# Patient Record
Sex: Male | Born: 1980 | Race: Black or African American | Hispanic: No | Marital: Married | Smoking: Current every day smoker
Health system: Southern US, Community
[De-identification: ages and names within clinical notes are randomized; demographics above are authoritative.]

---

## 2005-06-15 ENCOUNTER — Emergency Department: Payer: Self-pay | Admitting: Emergency Medicine

## 2006-09-05 IMAGING — CR DG CHEST 2V
1 series · 2 of 2 positions shown · non-contrast
Comparison: none

REASON FOR EXAM: Chest wall pain
COMMENTS:

PROCEDURE:     DXR - DXR CHEST PA (OR AP) AND LATERAL  - June 15, 2005 [DATE]
RESULT:          The lungs are well expanded with a suggestion of mild
hyperinflation.  There is no evidence of a pneumothorax, infiltrate, or
atelectasis.  I seen no pleural effusion.  The mediastinum is not widened.

[Series 1: view not recorded · 0.17mm/px · 2 of 2 slices shown]
[im 1/2]
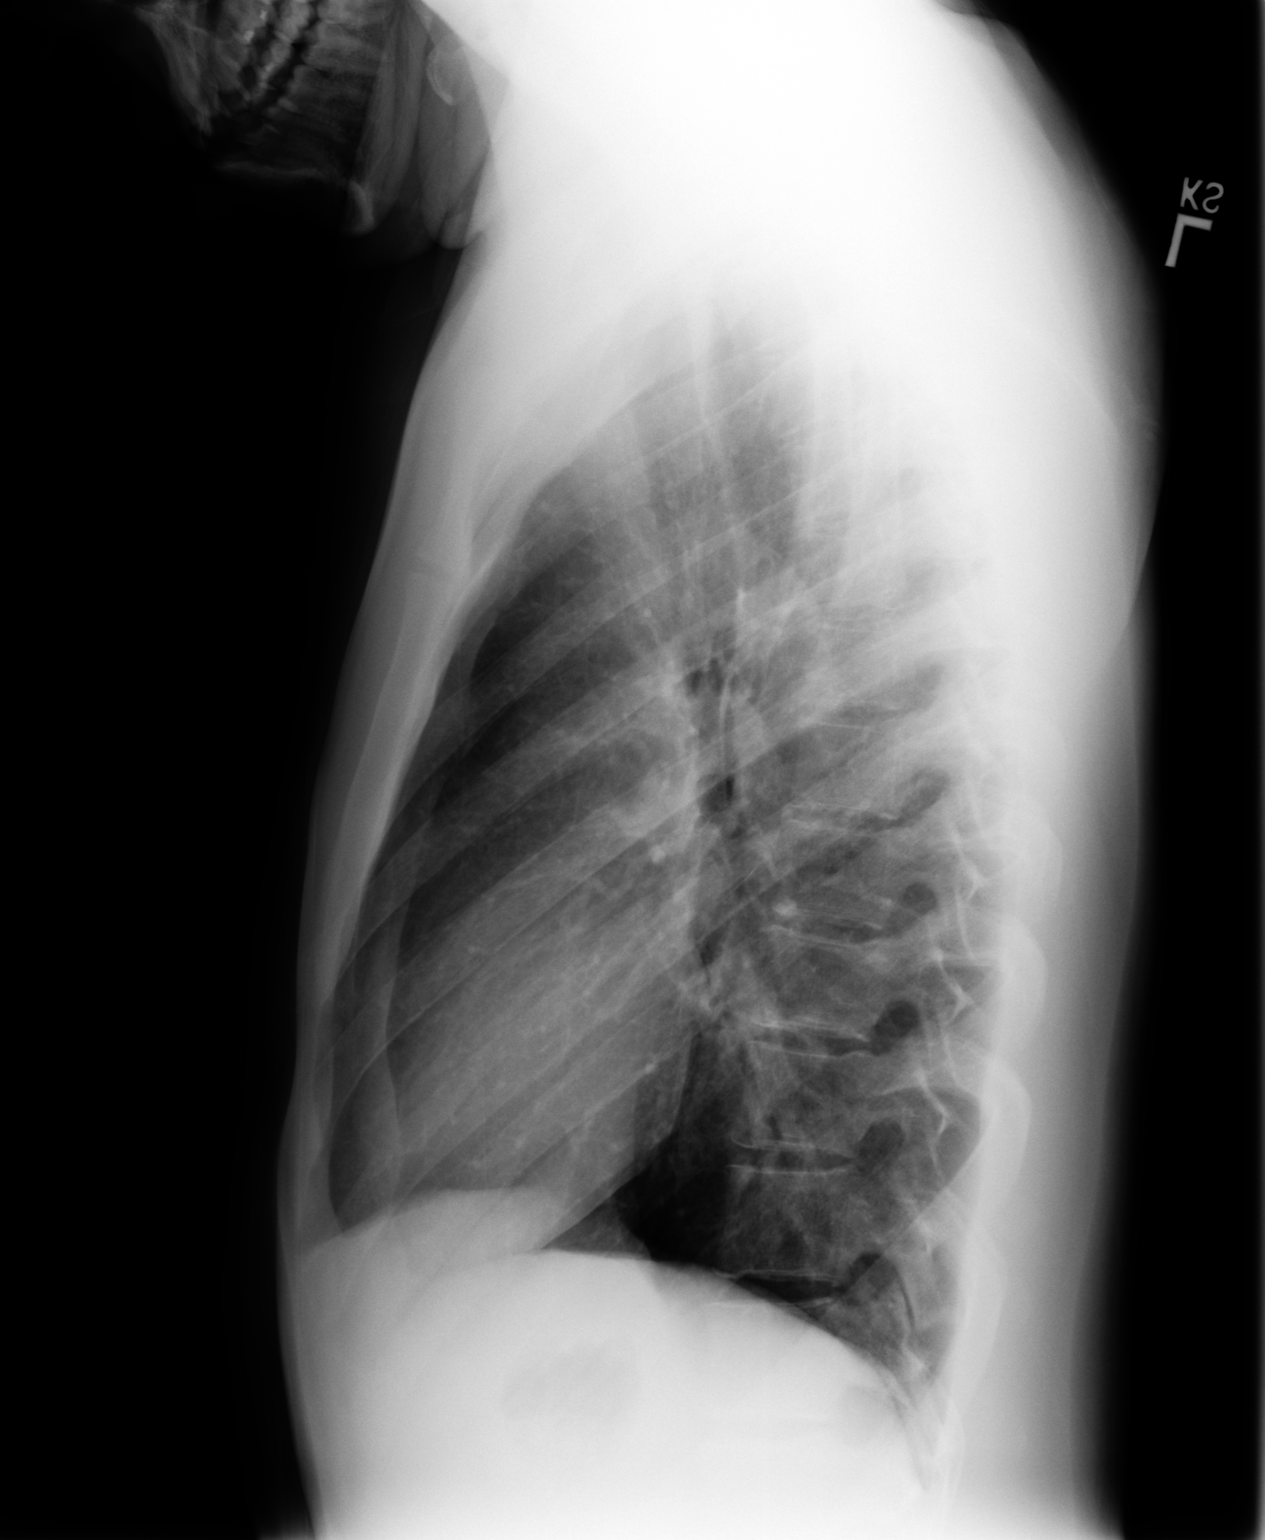
[im 2/2]
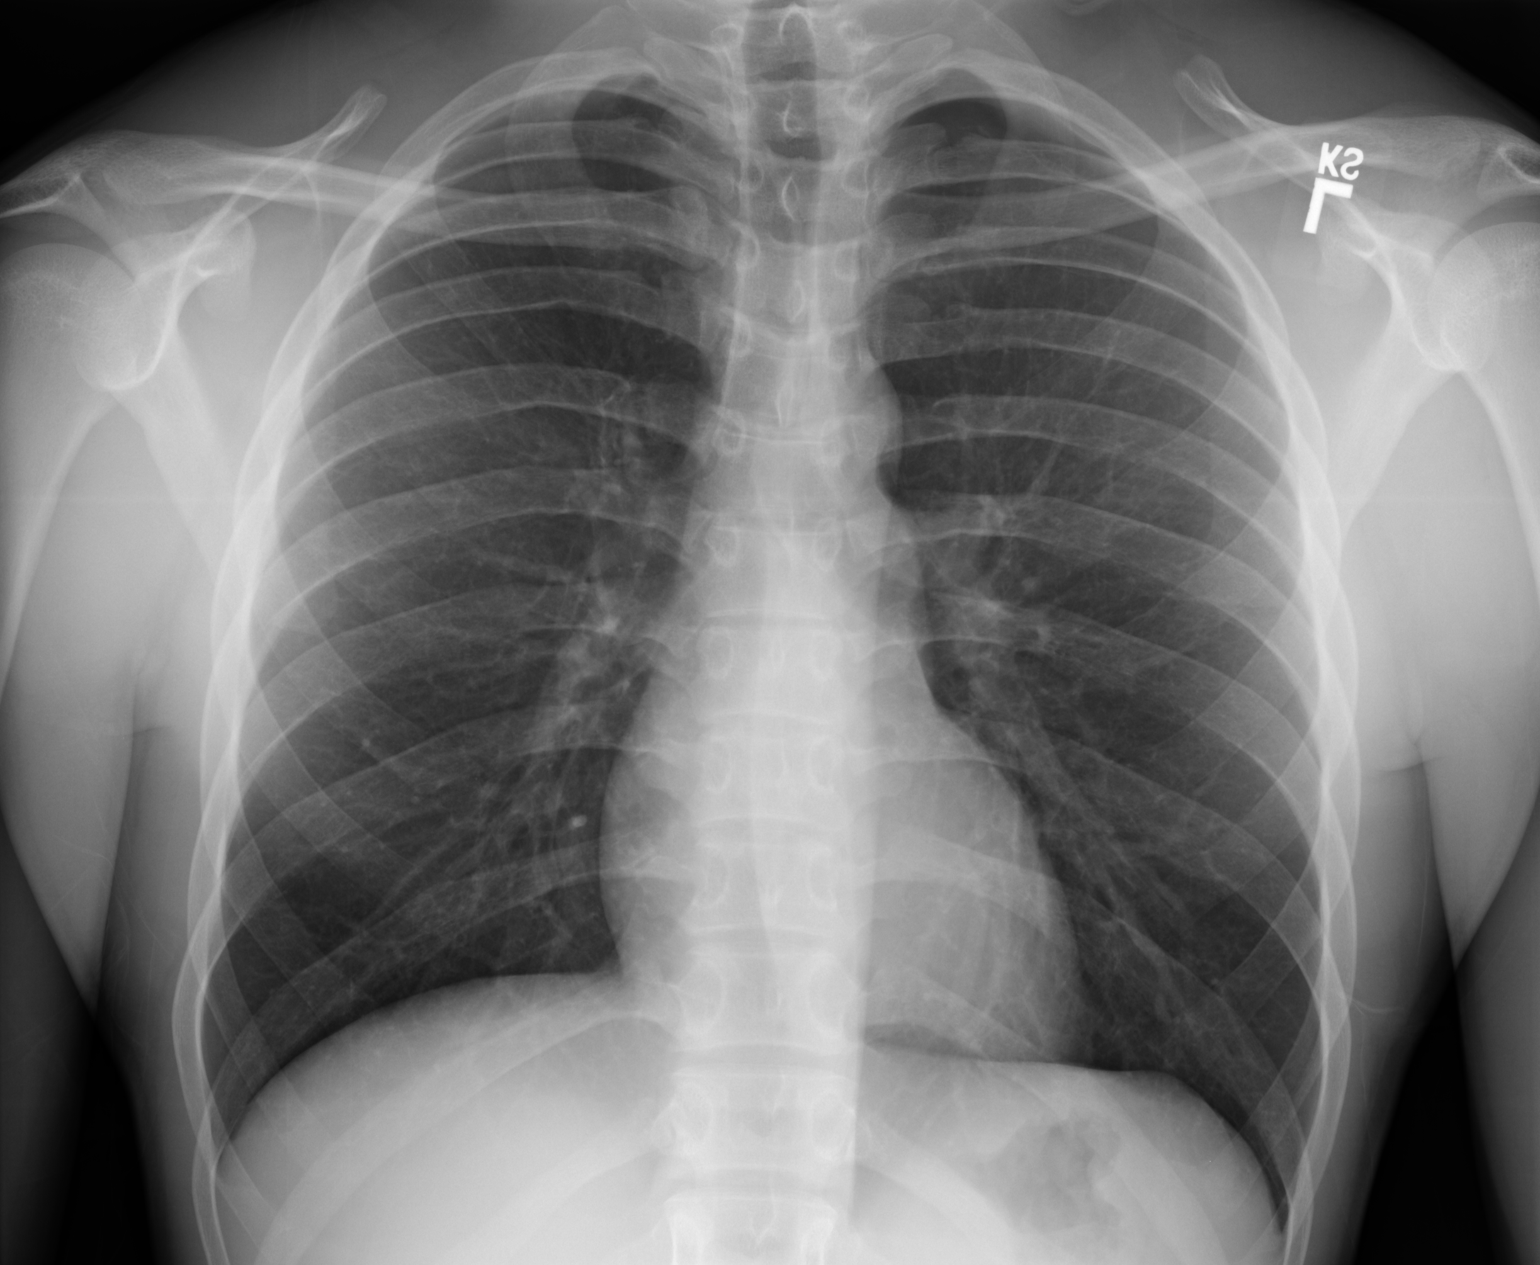

[2 of 2 positions shown; findings below may reference images not displayed]

IMPRESSION: I do not see evidence of  acute cardiopulmonary
disease.

## 2007-08-31 ENCOUNTER — Emergency Department: Payer: Self-pay | Admitting: Emergency Medicine

## 2007-10-15 ENCOUNTER — Emergency Department: Payer: Self-pay | Admitting: Emergency Medicine

## 2009-09-17 ENCOUNTER — Emergency Department: Payer: Self-pay | Admitting: Emergency Medicine

## 2012-08-13 ENCOUNTER — Ambulatory Visit: Payer: Self-pay | Admitting: Family Medicine

## 2014-07-30 ENCOUNTER — Ambulatory Visit: Payer: Self-pay | Admitting: Physician Assistant

## 2014-09-08 ENCOUNTER — Ambulatory Visit: Payer: Self-pay

## 2015-03-08 ENCOUNTER — Encounter: Payer: Self-pay | Admitting: Emergency Medicine

## 2015-03-08 ENCOUNTER — Emergency Department
Admission: EM | Admit: 2015-03-08 | Discharge: 2015-03-08 | Disposition: A | Discharge status: Home / Self Care | Payer: Self-pay | Attending: Emergency Medicine | Admitting: Emergency Medicine

## 2015-03-08 DIAGNOSIS — Z79899 Other long term (current) drug therapy: Secondary | ICD-10-CM | POA: Insufficient documentation

## 2015-03-08 DIAGNOSIS — B354 Tinea corporis: Secondary | ICD-10-CM | POA: Insufficient documentation

## 2015-03-08 DIAGNOSIS — Z720 Tobacco use: Secondary | ICD-10-CM | POA: Insufficient documentation

## 2015-03-08 MED ORDER — TERBINAFINE HCL 250 MG PO TABS: 250.0000 mg | ORAL_TABLET | Freq: Every day | ORAL | Status: AC

## 2015-03-08 NOTE — ED Notes (Signed)
States he has had rash to hips area with itching for sometime. Now has an area under left arm.

## 2015-03-08 NOTE — ED Provider Notes (Signed)
[facility] Emergency Department Provider Note  ____________________________________________  Time seen: Approximately 10:29 AM  I have reviewed the triage vital signs and the nursing notes.   HISTORY  Chief Complaint Rash    HPI Scott Clarke is a 34 y.o. male is for rash on both groins for approximately a year. No relief with hydrocortisone cream. In addition he complains of having a rash to his left axilla.  History reviewed. No pertinent past medical history.  There are no active problems to display for this patient.   History reviewed. No pertinent past surgical history.  Current Outpatient Rx  Name  Route  Sig  Dispense  Refill  . terbinafine (LAMISIL) 250 MG tablet   Oral   Take 1 tablet (250 mg total) by mouth daily.   42 tablet   0     Allergies Review of patient's allergies indicates no known allergies.  No family history on file.  Social History History  Substance Use Topics  . Smoking status: Current Every Day Smoker  . Smokeless tobacco: Never Used  . Alcohol Use: Yes    Review of Systems Constitutional: No fever/chills Eyes: No visual changes. ENT: No sore throat. Cardiovascular: Denies chest pain. Respiratory: Denies shortness of breath. Gastrointestinal: No abdominal pain.  No nausea, no vomiting.  No diarrhea.  No constipation. Genitourinary: Negative for dysuria. Musculoskeletal: Negative for back pain. Skin: Positive for rash in both groins. In left axilla Neurological: Negative for headaches, focal weakness or numbness.  10-point ROS otherwise negative.  ____________________________________________   PHYSICAL EXAM:  VITAL SIGNS: ED Triage Vitals  Enc Vitals Group     BP 03/08/15 1014 129/82 mmHg     Pulse Rate 03/08/15 1014 68     Resp 03/08/15 1014 20     Temp 03/08/15 1014 98.6 F (37 C)     Temp Source 03/08/15 1014 Oral     SpO2 03/08/15 1014 98 %     Weight 03/08/15 1014 205 lb  (92.987 kg)     Height 03/08/15 1014  (1.88 m)     Head Cir --      Peak Flow --      Pain Score --      Pain Loc --      Pain Edu? --      Excl. in [place]? --     Constitutional: Alert and oriented. Well appearing and in no acute distress. Eyes: Conjunctivae are normal. PERRL. EOMI. Head: Atraumatic. Nose: No congestion/rhinnorhea. Mouth/Throat: Mucous membranes are moist.  Oropharynx non-erythematous. Neck: No stridor.   Cardiovascular: Normal rate, regular rhythm. Grossly normal heart sounds.  Good peripheral circulation. Respiratory: Normal respiratory effort.  No retractions. Lungs CTAB. Gastrointestinal: Soft and nontender. No distention. No abdominal bruits. No CVA tenderness. Musculoskeletal: No lower extremity tenderness nor edema.  No joint effusions. Neurologic:  Normal speech and language. No gross focal neurologic deficits are appreciated. Speech is normal. No gait instability. Skin:  Left and right inguinal area extensive 8 x 14 cm rash with nummular edematous lesions with darkened center as opposed to a clear center. Typical presentation of a tinea corporis. New-onset pustular lesions left axilla consistent with same. Psychiatric: Mood and affect are normal. Speech and behavior are normal.  ____________________________________________   LABS (all labs ordered are listed, but only abnormal results are displayed)  Labs Reviewed - No data to display ____________________________________________  EKG  Deferred ____________________________________________  RADIOLOGY  Not applicable ____________________________________________   PROCEDURES  Procedure(s)  performed: None  Critical Care performed: No  ____________________________________________   INITIAL IMPRESSION / ASSESSMENT AND PLAN / ED COURSE  Pertinent labs & imaging results that were available during my care of the patient were reviewed by me and considered in my medical decision making (see chart  for details).  Tinea corporis. Rx given for terbinafine 250 mg daily for 6 weeks. She intended to follow-up with dermatologist as needed. He voices no other emergency medical complaints at this visit. ____________________________________________   FINAL CLINICAL IMPRESSION(S) / ED DIAGNOSES  Final diagnoses:  Tinea corporis      Evangeline DakinCharles M Meshelle Holness, PA-C 03/08/15 1053  Darien Ramusavid W Kaminski, MD 03/09/15 [phone]

## 2015-03-08 NOTE — Discharge Instructions (Signed)

## 2015-11-29 IMAGING — CR RIGHT FOOT COMPLETE - 3+ VIEW
3 series · 3 of 3 positions shown · non-contrast
Comparison: None.

CLINICAL DATA: Fell last night with pain and swelling of the dorsum
of the foot

EXAM:
RIGHT FOOT COMPLETE - 3+ VIEW

[foot ap]
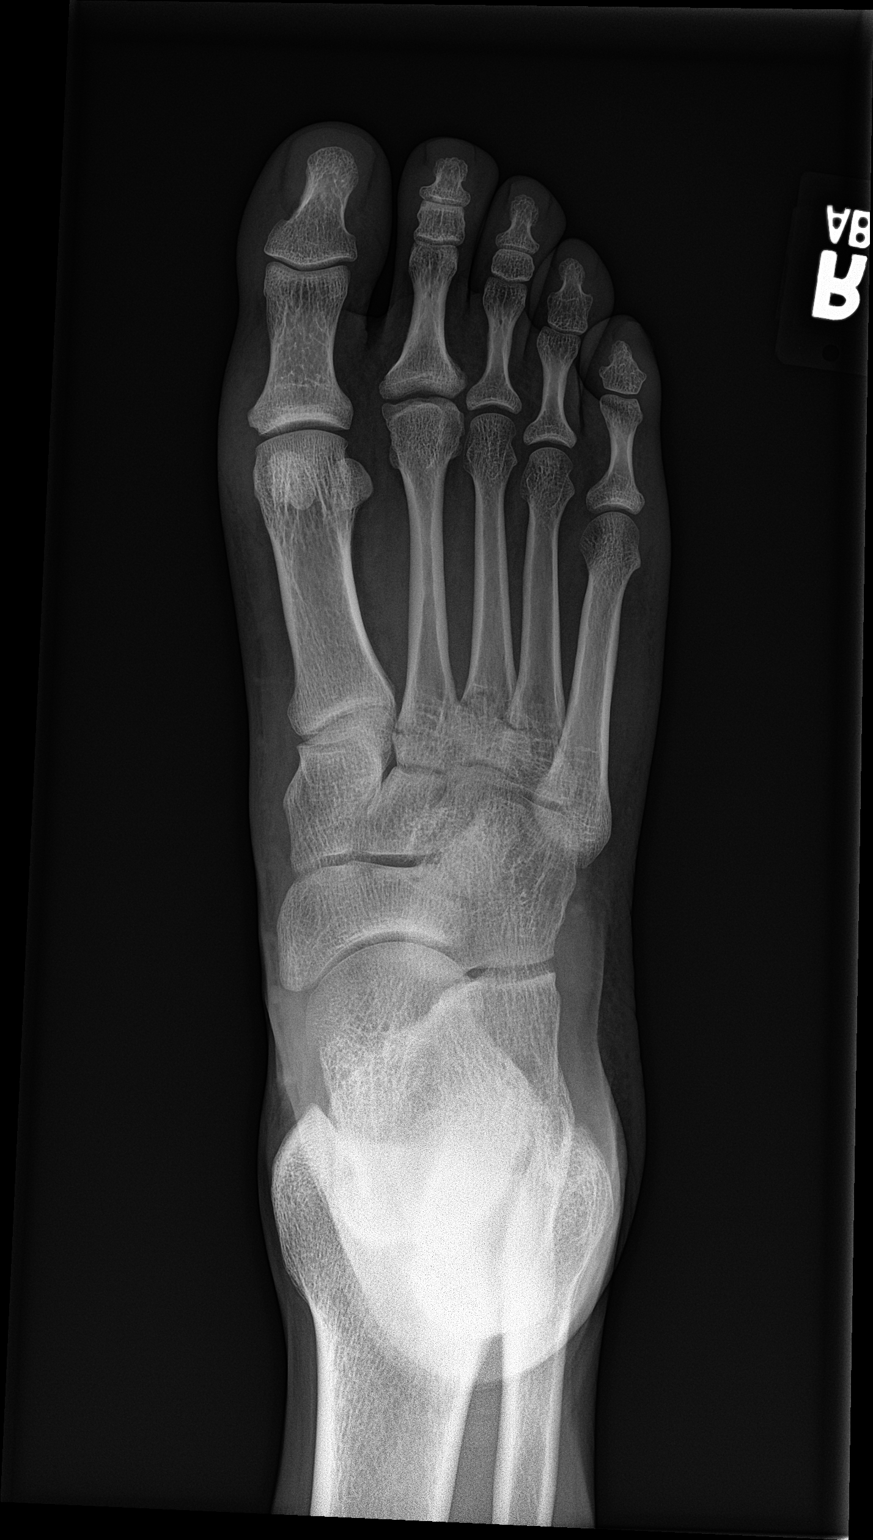

[foot obl]
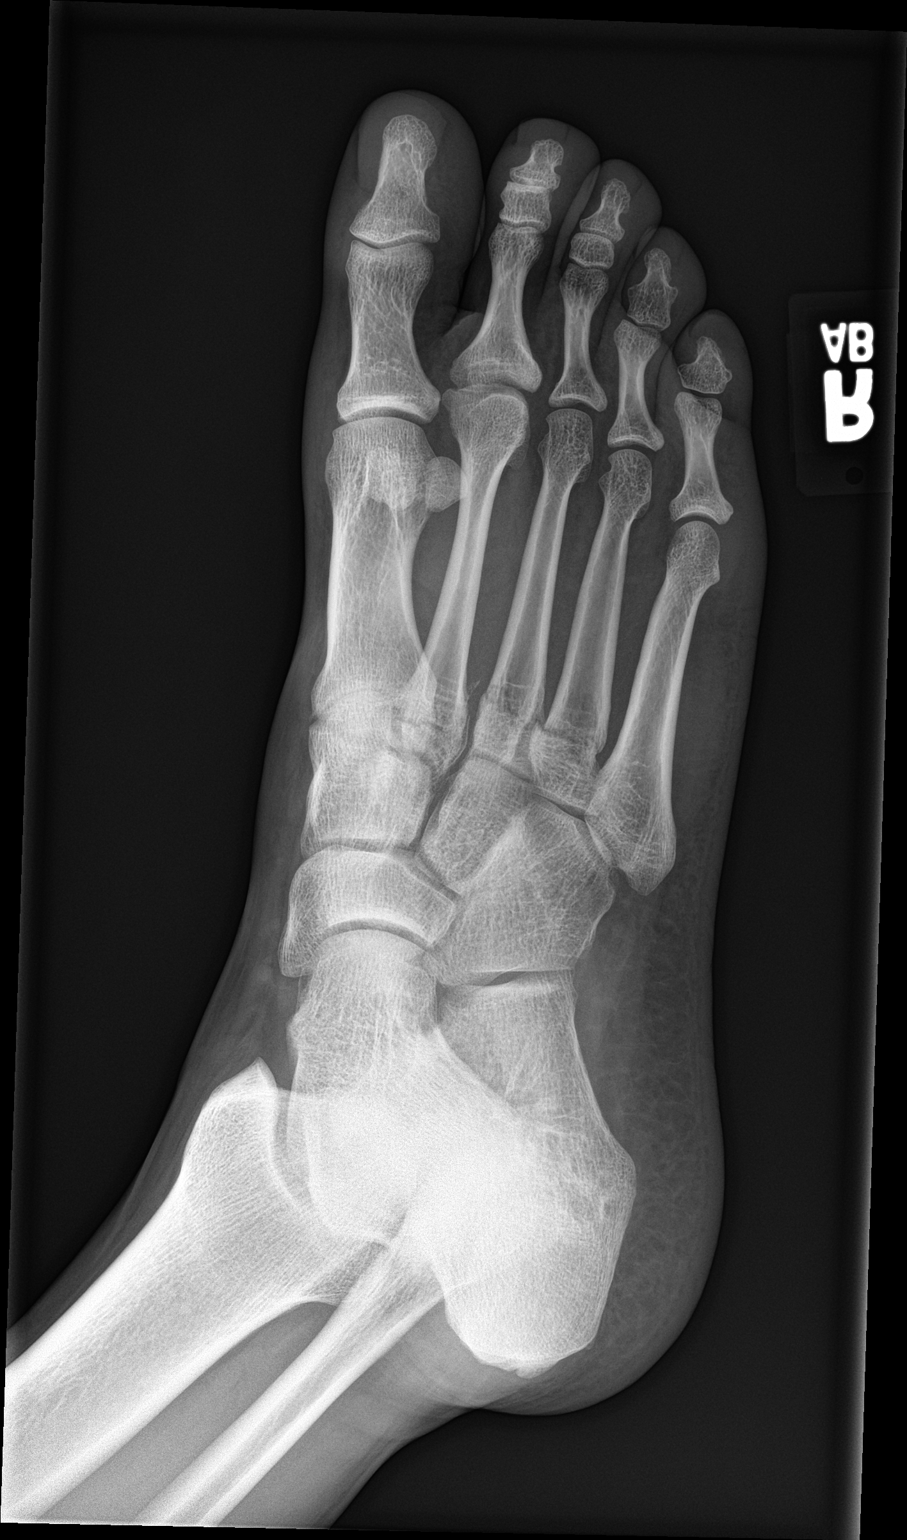

[foot lat]
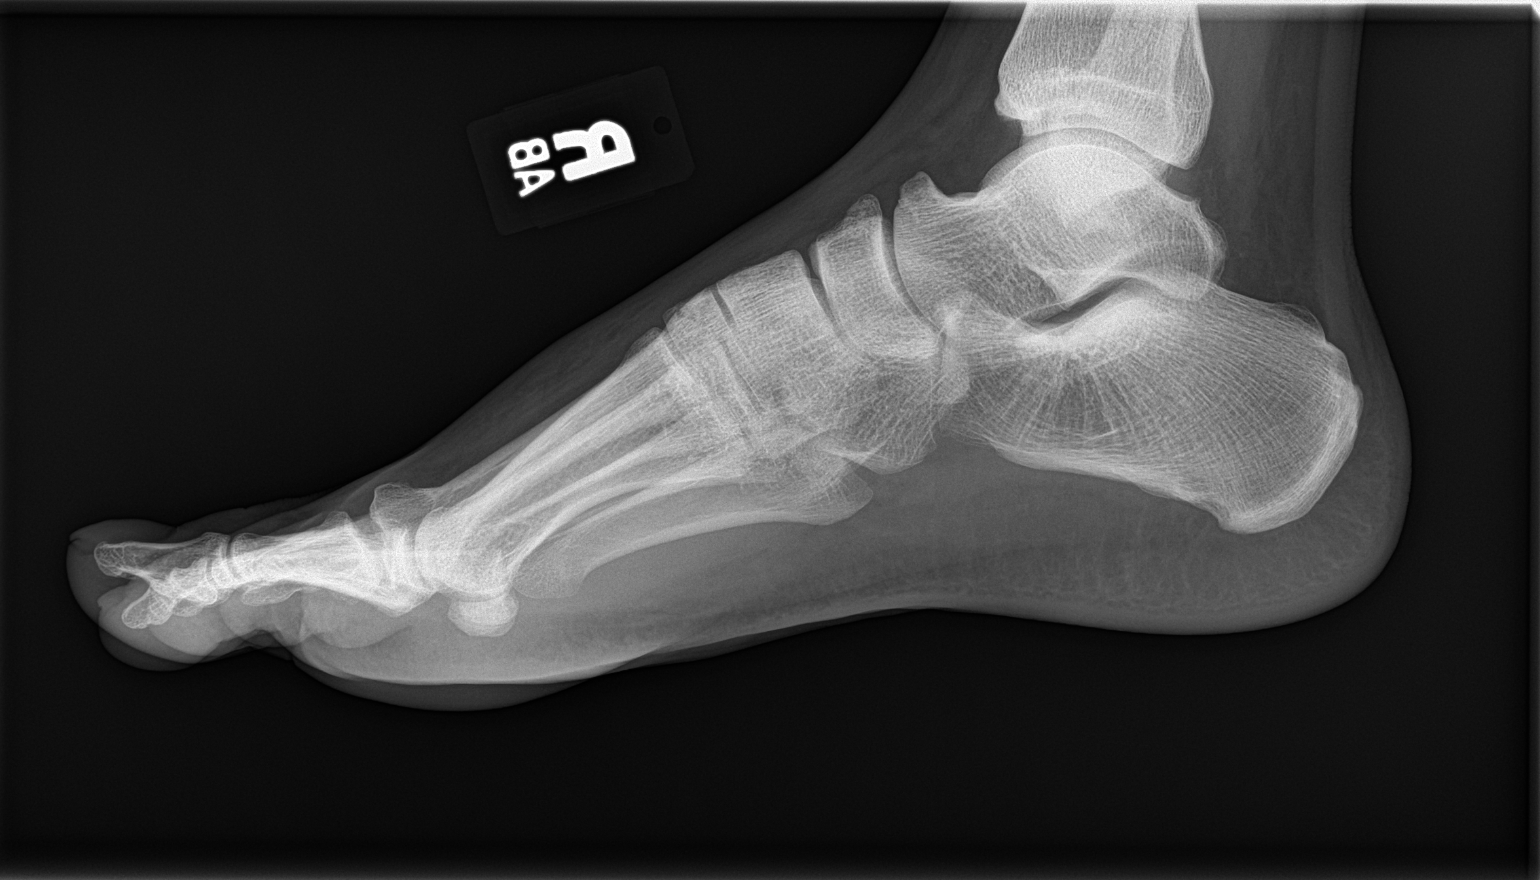

[3 of 3 positions shown; findings below may reference images not displayed]

FINDINGS: There are transverse nondisplaced fractures through the bases of the
right second, third, and fourth metatarsals. No other acute
abnormality is seen. Flattening of the head of the right second
metatarsal is noted consistent with avascular necrosis, Anybal
Sebbah infraction.

IMPRESSI:
IMPRESSI
Transverse acute fractures of the bases of the right second, third,
and fourth metatarsals.

## 2017-11-24 DIAGNOSIS — J3489 Other specified disorders of nose and nasal sinuses: Secondary | ICD-10-CM | POA: Diagnosis not present

## 2017-11-24 DIAGNOSIS — J339 Nasal polyp, unspecified: Secondary | ICD-10-CM | POA: Diagnosis not present

## 2017-11-24 DIAGNOSIS — K056 Periodontal disease, unspecified: Secondary | ICD-10-CM | POA: Diagnosis not present

## 2017-12-05 DIAGNOSIS — Z13220 Encounter for screening for lipoid disorders: Secondary | ICD-10-CM | POA: Diagnosis not present

## 2017-12-05 DIAGNOSIS — Z23 Encounter for immunization: Secondary | ICD-10-CM | POA: Diagnosis not present

## 2017-12-05 DIAGNOSIS — Z131 Encounter for screening for diabetes mellitus: Secondary | ICD-10-CM | POA: Diagnosis not present

## 2017-12-05 DIAGNOSIS — L723 Sebaceous cyst: Secondary | ICD-10-CM | POA: Diagnosis not present

## 2017-12-05 DIAGNOSIS — Z0000 Encounter for general adult medical examination without abnormal findings: Secondary | ICD-10-CM | POA: Diagnosis not present

## 2018-03-09 DIAGNOSIS — L720 Epidermal cyst: Secondary | ICD-10-CM | POA: Diagnosis not present

## 2018-03-09 DIAGNOSIS — L918 Other hypertrophic disorders of the skin: Secondary | ICD-10-CM | POA: Diagnosis not present

## 2018-03-09 DIAGNOSIS — L83 Acanthosis nigricans: Secondary | ICD-10-CM | POA: Diagnosis not present

## 2018-03-25 DIAGNOSIS — L720 Epidermal cyst: Secondary | ICD-10-CM | POA: Diagnosis not present

## 2018-03-25 DIAGNOSIS — L0291 Cutaneous abscess, unspecified: Secondary | ICD-10-CM | POA: Diagnosis not present

## 2018-03-25 DIAGNOSIS — L700 Acne vulgaris: Secondary | ICD-10-CM | POA: Diagnosis not present

## 2018-04-02 DIAGNOSIS — L0201 Cutaneous abscess of face: Secondary | ICD-10-CM | POA: Diagnosis not present

## 2018-05-18 DIAGNOSIS — L0201 Cutaneous abscess of face: Secondary | ICD-10-CM | POA: Diagnosis not present

## 2022-10-18 ENCOUNTER — Encounter: Payer: Self-pay | Admitting: Emergency Medicine

## 2022-10-18 ENCOUNTER — Ambulatory Visit
Admission: EM | Admit: 2022-10-18 | Discharge: 2022-10-18 | Disposition: A | Discharge status: Home / Self Care | Payer: PRIVATE HEALTH INSURANCE | Attending: Emergency Medicine | Admitting: Emergency Medicine

## 2022-10-18 DIAGNOSIS — B349 Viral infection, unspecified: Secondary | ICD-10-CM | POA: Diagnosis present

## 2022-10-18 DIAGNOSIS — Z20822 Contact with and (suspected) exposure to covid-19: Secondary | ICD-10-CM | POA: Insufficient documentation

## 2022-10-18 DIAGNOSIS — J029 Acute pharyngitis, unspecified: Secondary | ICD-10-CM | POA: Diagnosis not present

## 2022-10-18 DIAGNOSIS — R051 Acute cough: Secondary | ICD-10-CM

## 2022-10-18 LAB — RESP PANEL BY RT-PCR (RSV, FLU A&B, COVID)  RVPGX2
Influenza A by PCR: NEGATIVE
Influenza B by PCR: NEGATIVE
Resp Syncytial Virus by PCR: NEGATIVE
SARS Coronavirus 2 by RT PCR: NEGATIVE

## 2022-10-18 LAB — GROUP A STREP BY PCR: Group A Strep by PCR: NOT DETECTED

## 2022-10-18 MED ORDER — PROMETHAZINE-DM 6.25-15 MG/5ML PO SYRP: 5.0000 mL | ORAL_SOLUTION | Freq: Four times a day (QID) | ORAL | 0 refills | Status: AC | PRN

## 2022-10-18 NOTE — Discharge Instructions (Signed)
-  You tested negative for COVID, flu and RSV today.  Take 1 more COVID test tomorrow and if it is positive isolate 5 days from symptom onset and wear mask for 5 days.  Consider also taking the antiviral medication you are prescribed by the telemedicine provider.  If that test tomorrow is negative, I would not bother taking any other tests. - We are checking for strep.  I will call you and send antibiotics if your strep is positive.  If not hear from me, it is negative and your symptoms are consistent with a virus.  I sent cough medicine.  You may also take Tylenol Motrin as needed for fever control, rest and increase fluids. - If you have any uncontrolled fever, weakness or breathing difficulty, please return for reevaluation.

## 2022-10-18 NOTE — ED Triage Notes (Signed)
Patient c/o cough, nasal congestion, and bodyaches that started 2 days ago.  Patient unsure of fevers.

## 2022-10-18 NOTE — ED Provider Notes (Signed)
[facility]    CSN: [id number] Arrival date & time: 10/18/22  1428      History   Chief Complaint Chief Complaint  Patient presents with   Cough   Generalized Body Aches    HPI Scott Clarke is a 42 y.o. male presenting for fever up to 101 degrees, fatigue, cough, congestion, body aches, diarrhea, and headaches x 2 days.  He reports exposure to COVID through his wife and a co-worker.  Has had negative home COVID tests. Denies breathing difficulty, vomiting.  Has tried OTC meds for symptoms.  He is a smoker.  He is otherwise healthy.  No other complaints.   HPI  History reviewed. No pertinent past medical history.  There are no problems to display for this patient.   History reviewed. No pertinent surgical history.     Home Medications    Prior to Admission medications   Medication Sig Start Date End Date Taking? Authorizing Provider  promethazine-dextromethorphan (PROMETHAZINE-DM) 6.25-15 MG/5ML syrup Take 5 mLs by mouth 4 (four) times daily as needed. 10/18/22  Yes Laurene Footman B, PA-C  terbinafine (LAMISIL) 250 MG tablet Take 1 tablet (250 mg total) by mouth daily. 03/08/15   Beers, Pierce Crane, PA-C    Family History History reviewed. No pertinent family history.  Social History Social History   Tobacco Use   Smoking status: Every Day    Types: Cigarettes   Smokeless tobacco: Never  Vaping Use   Vaping Use: Never used  Substance Use Topics   Alcohol use: Yes   Drug use: No     Allergies   Patient has no known allergies.   Review of Systems Review of Systems  Constitutional:  Positive for fatigue and fever.  HENT:  Positive for congestion and rhinorrhea. Negative for sinus pressure, sinus pain and sore throat.   Respiratory:  Positive for cough. Negative for shortness of breath.   Gastrointestinal:  Negative for abdominal pain, diarrhea, nausea and vomiting.  Musculoskeletal:  Positive for myalgias.  Neurological:  Negative for  weakness, light-headedness and headaches.  Hematological:  Negative for adenopathy.     Physical Exam Triage Vital Signs ED Triage Vitals  Enc Vitals Group     BP      Pulse      Resp      Temp      Temp src      SpO2      Weight      Height      Head Circumference      Peak Flow      Pain Score      Pain Loc      Pain Edu?      Excl. in [place]?    No data found.  Updated Vital Signs BP (!) 152/103   Pulse (!) 101   Temp 99 F (37.2 C) (Oral)   Resp 15   Ht 6' 2"$  (1.88 m)   Wt 206 lb (93.4 kg)   SpO2 99%   BMI 26.45 kg/m      Physical Exam Vitals and nursing note reviewed.  Constitutional:      General: He is not in acute distress.    Appearance: Normal appearance. He is well-developed. He is not ill-appearing.  HENT:     Head: Normocephalic and atraumatic.     Nose: Congestion present.     Mouth/Throat:     Mouth: Mucous membranes are moist.     Pharynx: Oropharynx is clear.  Posterior oropharyngeal erythema present.  Eyes:     General: No scleral icterus.    Conjunctiva/sclera: Conjunctivae normal.  Cardiovascular:     Rate and Rhythm: Regular rhythm. Tachycardia present.     Heart sounds: Normal heart sounds.  Pulmonary:     Effort: Pulmonary effort is normal. No respiratory distress.     Breath sounds: Normal breath sounds.  Musculoskeletal:     Cervical back: Neck supple.  Skin:    General: Skin is warm and dry.     Capillary Refill: Capillary refill takes less than 2 seconds.  Neurological:     General: No focal deficit present.     Mental Status: He is alert. Mental status is at baseline.     Motor: No weakness.     Gait: Gait normal.  Psychiatric:        Mood and Affect: Mood normal.        Behavior: Behavior normal.      UC Treatments / Results  Labs (all labs ordered are listed, but only abnormal results are displayed) Labs Reviewed  RESP PANEL BY RT-PCR (RSV, FLU A&B, COVID)  [id number]  GROUP A STREP BY PCR     EKG   Radiology No results found.  Procedures Procedures (including critical care time)  Medications Ordered in UC Medications - No data to display  Initial Impression / Assessment and Plan / UC Course  I have reviewed the triage vital signs and the nursing notes.  Pertinent labs & imaging results that were available during my care of the patient were reviewed by me and considered in my medical decision making (see chart for details).   42 year old male presents for fever, fatigue, cough, congestion x 2 days.  He has been exposed to [place] through his wife and a [employer]ud[employer].  He has had negative home COVID tests.  He is afebrile and overall well-appearing.  On exam he has nasal congestion.  Mild erythema of posterior pharynx. Chest clear to auscultation and heart regular rhythm.   Respiratory panel performed and all negative testing. Negative testing.   Viral illness. Supportive care. Sent promethazine DM. Reviewed return and ED precautions.   Final Clinical Impressions(s) / UC Diagnoses   Final diagnoses:  Viral illness  Acute cough  Sore throat  Exposure to COVID-19 virus     Discharge Instructions      -You tested negative for COVID, flu and RSV today.  Take 1 more COVID test tomorrow and if it is positive isolate 5 days from symptom onset and wear mask for 5 days.  Consider also taking the antiviral medication you are prescribed by the telemedicine provider.  If that test tomorrow is negative, I would not bother taking any other tests. - We are checking for strep.  I will call you and send antibiotics if your strep is positive.  If not hear from me, it is negative and your symptoms are consistent with a virus.  I sent cough medicine.  You may also take Tylenol Motrin as needed for fever control, rest and increase fluids. - If you have any uncontrolled fever, weakness or breathing difficulty, please return for reevaluation.     ED Prescriptions     Medication Sig  Dispense Auth. Provider   promethazine-dextromethorphan (PROMETHAZINE-DM) 6.25-15 MG/5ML syrup Take 5 mLs by mouth 4 (four) times daily as needed. 118 mL Danton Clap, PA-C      PDMP not reviewed this encounter.   Danton Clap, PA-C 10/18/22 1702
